# Patient Record
Sex: Female | Born: 1970 | Hispanic: No | Marital: Single | Smoking: Never smoker
Health system: Southern US, Community
[De-identification: ages and names within clinical notes are randomized; demographics above are authoritative.]

## PROBLEM LIST (undated history)

## (undated) DIAGNOSIS — E119 Type 2 diabetes mellitus without complications: Secondary | ICD-10-CM

## (undated) HISTORY — PX: SHOULDER SURGERY: SHX246

---

## 2002-01-02 ENCOUNTER — Other Ambulatory Visit: Admission: RE | Admit: 2002-01-02 | Discharge: 2002-01-02 | Payer: Self-pay | Admitting: Obstetrics and Gynecology

## 2003-01-21 ENCOUNTER — Other Ambulatory Visit: Admission: RE | Admit: 2003-01-21 | Discharge: 2003-01-21 | Payer: Self-pay | Admitting: Obstetrics and Gynecology

## 2004-02-04 ENCOUNTER — Other Ambulatory Visit: Admission: RE | Admit: 2004-02-04 | Discharge: 2004-02-04 | Payer: Self-pay | Admitting: Obstetrics and Gynecology

## 2005-03-23 ENCOUNTER — Other Ambulatory Visit: Admission: RE | Admit: 2005-03-23 | Discharge: 2005-03-23 | Payer: Self-pay | Admitting: Obstetrics and Gynecology

## 2006-06-08 ENCOUNTER — Other Ambulatory Visit: Admission: RE | Admit: 2006-06-08 | Discharge: 2006-06-08 | Payer: Self-pay | Admitting: Obstetrics and Gynecology

## 2010-12-07 ENCOUNTER — Ambulatory Visit: Admission: RE | Admit: 2010-12-07 | Payer: Self-pay | Source: Home / Self Care | Admitting: Emergency Medicine

## 2010-12-07 ENCOUNTER — Ambulatory Visit
Admission: RE | Admit: 2010-12-07 | Discharge: 2010-12-07 | Payer: Self-pay | Source: Home / Self Care | Admitting: Emergency Medicine

## 2010-12-08 DIAGNOSIS — M79609 Pain in unspecified limb: Secondary | ICD-10-CM | POA: Insufficient documentation

## 2010-12-08 DIAGNOSIS — E109 Type 1 diabetes mellitus without complications: Secondary | ICD-10-CM | POA: Insufficient documentation

## 2010-12-14 ENCOUNTER — Telehealth (INDEPENDENT_AMBULATORY_CARE_PROVIDER_SITE_OTHER): Payer: Self-pay | Admitting: *Deleted

## 2010-12-16 NOTE — Assessment & Plan Note (Signed)
Summary: INJURY TO RIGHT GREAT TOE/TJ   Vital Signs:  Patient Profile:   40 Years Old Female CC:      RT great toe pain Height:     68 inches Weight:      165 pounds O2 Sat:      99 % O2 treatment:    Room Air Temp:     98.3 degrees F oral Pulse rate:   107 / minute Pulse rhythm:   regular Resp:     16 per minute BP sitting:   124 / 86  (right arm) Cuff size:   regular  Vitals Entered By: Clemens Catholic LPN (December 08, 2010 8:34 AM)                  Updated Prior Medication List: RELPAX 20 MG TABS (ELETRIPTAN HYDROBROMIDE)  YAZ 3-0.02 MG TABS (DROSPIRENONE-ETHINYL ESTRADIOL)  TOPAMAX 25 MG TABS (TOPIRAMATE)  REQUIP 0.25 MG TABS (ROPINIROLE HCL)  Insulin- pt is unsure of name Current Allergies: ! CODEINEHistory of Present Illness History from: patient Chief Complaint: RT great toe pain History of Present Illness: R great toe pain.  Kicked a faux brick wall today at noon today.  She has developed throbbing pain and bruising and swelling of the toe.  Pain is worse with walking and she is now limping. Motrin helps.  She is concerned because she has to attend a military ball this weekend.  REVIEW OF SYSTEMS Constitutional Symptoms      Denies fever, chills, night sweats, weight loss, weight gain, and fatigue.  Eyes       Denies change in vision, eye pain, eye discharge, glasses, contact lenses, and eye surgery. Ear/Nose/Throat/Mouth       Denies hearing loss/aids, change in hearing, ear pain, ear discharge, dizziness, frequent runny nose, frequent nose bleeds, sinus problems, sore throat, hoarseness, and tooth pain or bleeding.  Respiratory       Denies dry cough, productive cough, wheezing, shortness of breath, asthma, bronchitis, and emphysema/COPD.  Cardiovascular       Denies murmurs, chest pain, and tires easily with exhertion.    Gastrointestinal       Denies stomach pain, nausea/vomiting, diarrhea, constipation, blood in bowel movements, and  indigestion. Genitourniary       Denies painful urination, kidney stones, and loss of urinary control. Neurological       Denies paralysis, seizures, and fainting/blackouts. Musculoskeletal       Denies muscle pain, joint pain, joint stiffness, decreased range of motion, redness, swelling, muscle weakness, and gout.  Skin       Denies bruising, unusual mles/lumps or sores, and hair/skin or nail changes.  Psych       Denies mood changes, temper/anger issues, anxiety/stress, speech problems, depression, and sleep problems. Other Comments: pt c/o RT great toe pain. pt states she kicked a brick wall today. she has taken OTC Motrin.   Past History:  Past Medical History: Diabetes mellitus, type I migraines  Past Surgical History: bilateral shoulder surgery 2007  Family History: mother- diabetes father- MI  Social History: Never Smoked Alcohol use-no Drug use-no Smoking Status:  never Drug Use:  no Physical Exam General appearance: well developed, well nourished, no acute distress but does have limping gait MSE: oriented to time, place, and person R foot: FROM MTP.  ROM of IP is reduced due to pain.  Ecchymoses at IP and prox phalynx of great toe.  Pain with axial loading and TTP at dorsum of IP.  No  pain plantar or sides.  No laxity of collateral ligaments.  Distal NV status intact. Assessment New Problems: DIABETES MELLITUS, TYPE I (ICD-250.01) TOE PAIN (ICD-729.5)   Plan New Orders: New Patient Level III [99203] T-DG Toe Great*R* [73660] Post-op Shoe [L3260] Planning Comments:   Xray is read by radiology as normal.  However on the lateral view I may see a bony defect on the superior-proximal aspect of the distal phalynx.  So may just be impaction contusion vs ligament sprain.  Buddy tape, ice, rest, elevation, Motrin if needed.  Also gave post-op shoe which she feels is more comfortable when walking.  Expect throbbing pain for a few weeks.  I would like her to follow up  with Dr. Pearletha Forge in 1-2 weeks.   The patient and/or caregiver has been counseled thoroughly with regard to medications prescribed including dosage, schedule, interactions, rationale for use, and possible side effects and they verbalize understanding.  Diagnoses and expected course of recovery discussed and will return if not improved as expected or if the condition worsens. Patient and/or caregiver verbalized understanding.   Orders Added: 1)  New Patient Level III [99203] 2)  T-DG Toe Great*R* [73660] 3)  Post-op Shoe [L3260]

## 2010-12-22 NOTE — Progress Notes (Signed)
  Phone Note Outgoing Call Call back at Work Phone [phone]   Call placed by: Emilio Math,  December 14, 2010 12:49 PM Call placed to: Patient Summary of Call: Left msg to ck on patient's toe

## 2014-12-31 ENCOUNTER — Encounter (HOSPITAL_BASED_OUTPATIENT_CLINIC_OR_DEPARTMENT_OTHER): Payer: Self-pay | Admitting: Emergency Medicine

## 2014-12-31 ENCOUNTER — Emergency Department (HOSPITAL_BASED_OUTPATIENT_CLINIC_OR_DEPARTMENT_OTHER)
Admission: EM | Admit: 2014-12-31 | Discharge: 2014-12-31 | Disposition: A | Discharge status: Home / Self Care | Payer: BC Managed Care – PPO | Attending: Emergency Medicine | Admitting: Emergency Medicine

## 2014-12-31 ENCOUNTER — Emergency Department (HOSPITAL_BASED_OUTPATIENT_CLINIC_OR_DEPARTMENT_OTHER): Payer: BC Managed Care – PPO

## 2014-12-31 DIAGNOSIS — Y9289 Other specified places as the place of occurrence of the external cause: Secondary | ICD-10-CM | POA: Diagnosis not present

## 2014-12-31 DIAGNOSIS — Z793 Long term (current) use of hormonal contraceptives: Secondary | ICD-10-CM | POA: Insufficient documentation

## 2014-12-31 DIAGNOSIS — Y9389 Activity, other specified: Secondary | ICD-10-CM | POA: Diagnosis not present

## 2014-12-31 DIAGNOSIS — Z79899 Other long term (current) drug therapy: Secondary | ICD-10-CM | POA: Diagnosis not present

## 2014-12-31 DIAGNOSIS — E119 Type 2 diabetes mellitus without complications: Secondary | ICD-10-CM | POA: Diagnosis not present

## 2014-12-31 DIAGNOSIS — S52122A Displaced fracture of head of left radius, initial encounter for closed fracture: Secondary | ICD-10-CM

## 2014-12-31 DIAGNOSIS — W1839XA Other fall on same level, initial encounter: Secondary | ICD-10-CM | POA: Insufficient documentation

## 2014-12-31 DIAGNOSIS — R52 Pain, unspecified: Secondary | ICD-10-CM

## 2014-12-31 DIAGNOSIS — Y998 Other external cause status: Secondary | ICD-10-CM | POA: Diagnosis not present

## 2014-12-31 DIAGNOSIS — S59902A Unspecified injury of left elbow, initial encounter: Secondary | ICD-10-CM | POA: Diagnosis present

## 2014-12-31 HISTORY — DX: Type 2 diabetes mellitus without complications: E11.9

## 2014-12-31 MED ORDER — IBUPROFEN 800 MG PO TABS
800.0000 mg | ORAL_TABLET | Freq: Once | ORAL | Status: AC
  Administered 2014-12-31: 800 mg via ORAL
  Filled 2014-12-31: qty 1

## 2014-12-31 MED ORDER — IBUPROFEN 800 MG PO TABS: 800.0000 mg | ORAL_TABLET | Freq: Three times a day (TID) | ORAL | Status: AC

## 2014-12-31 MED ORDER — OXYCODONE-ACETAMINOPHEN 5-325 MG PO TABS
2.0000 | ORAL_TABLET | Freq: Once | ORAL | Status: AC
  Administered 2014-12-31: 2 via ORAL
  Filled 2014-12-31: qty 2

## 2014-12-31 MED ORDER — OXYCODONE-ACETAMINOPHEN 5-325 MG PO TABS: 1.0000 | ORAL_TABLET | Freq: Four times a day (QID) | ORAL | Status: AC | PRN

## 2014-12-31 NOTE — ED Provider Notes (Signed)
CSN: [id number]     Arrival date & time 12/31/14  0117 History   First MD Initiated Contact with Patient 12/31/14 0245     Chief Complaint  Patient presents with  . Elbow Pain     (Consider location/radiation/quality/duration/timing/severity/associated sxs/prior Treatment) Patient is a 44 y.o. female presenting with arm injury. The history is provided by the patient.  Arm Injury Location:  Elbow Injury: yes   Mechanism of injury: fall   Fall:    Fall occurred:  Standing and running   Impact surface:  Hard floor   Point of impact: elbow.   Entrapped after fall: no   Elbow location:  L elbow Pain details:    Quality:  Aching   Severity:  Severe   Onset quality:  Sudden   Timing:  Constant Chronicity:  New Handedness:  Right-handed Dislocation: no   Foreign body present:  No foreign bodies Tetanus status:  Up to date Relieved by:  Nothing Worsened by:  Nothing tried Ineffective treatments:  None tried Associated symptoms: no back pain and no neck pain   Risk factors: no frequent fractures     Past Medical History  Diagnosis Date  . Diabetes mellitus without complication    Past Surgical History  Procedure Laterality Date  . Shoulder surgery Bilateral    History reviewed. No pertinent family history. History  Substance Use Topics  . Smoking status: Never Smoker   . Smokeless tobacco: Not on file  . Alcohol Use: No   OB History    No data available     Review of Systems  Musculoskeletal: Negative for back pain and neck pain.  All other systems reviewed and are negative.     Allergies  Codeine  Home Medications   Prior to Admission medications   Medication Sig Start Date End Date Taking? Authorizing Provider  drospirenone-ethinyl estradiol (YAZ,GIANVI,LORYNA) 3-0.02 MG tablet Take 1 tablet by mouth daily.   Yes Historical Provider, MD  Insulin Infusion Pump KIT by Does not apply route.   Yes Historical Provider, MD  topiramate (TOPAMAX) 15 MG capsule  Take 10 mg by mouth 3 (three) times daily.   Yes Historical Provider, MD   BP 124/60 mmHg  Pulse 94  Temp(Src) 98.5 F (36.9 C) (Oral)  Resp 18  Ht _0  (1.702 m)  Wt 170 lb (77.111 kg)  BMI 26.62 kg/m2  SpO2 99%  LMP 12/24/2014 Physical Exam  Constitutional: She is oriented to person, place, and time. She appears well-developed and well-nourished. No distress.  HENT:  Head: Normocephalic and atraumatic.  Mouth/Throat: Oropharynx is clear and moist.  Eyes: Conjunctivae are normal. Pupils are equal, round, and reactive to light.  Neck: Normal range of motion. Neck supple.  Cardiovascular: Normal rate, regular rhythm and intact distal pulses.   Pulmonary/Chest: Effort normal and breath sounds normal. She has no wheezes. She has no rales.  Abdominal: Soft. Bowel sounds are normal. There is no tenderness.  Musculoskeletal: She exhibits tenderness.       Left elbow: She exhibits swelling. She exhibits normal range of motion, no effusion, no deformity and no laceration. Tenderness found. Radial head tenderness noted. No medial epicondyle, no lateral epicondyle and no olecranon process tenderness noted.  Neurological: She is alert and oriented to person, place, and time. She has normal reflexes.  Skin: Skin is warm and dry.  Psychiatric: She has a normal mood and affect.    ED Course  Procedures (including critical care time) Labs Review Labs Reviewed -  No data to display  Imaging Review Dg Elbow Complete Left  12/31/2014   CLINICAL DATA:  Left elbow pain after fall last night  EXAM: LEFT ELBOW - COMPLETE 3+ VIEW  COMPARISON:  None.  FINDINGS: There is a radial head fracture. There is an elbow joint hemarthrosis. The radial head fracture is minimally displaced.  IMPRESSION: Radial head fracture with hemarthrosis   Electronically Signed   By: Andreas Newport M.D.   On: 12/31/2014 02:35     EKG Interpretation None      MDM   Final diagnoses:  Pain    Ice elevation,  percocet, ibuprofen, and close follow up with Dr. Amedeo Plenty.  Strict return precautions given   Marinus Eicher K Amey Hossain-Rasch, MD 12/31/14 [phone]

## 2014-12-31 NOTE — ED Notes (Signed)
Pt states she was running through the house and fell on left elbow injuring it.

## 2019-12-29 ENCOUNTER — Ambulatory Visit: Payer: BC Managed Care – PPO

## 2022-03-31 ENCOUNTER — Other Ambulatory Visit: Payer: Self-pay | Admitting: Obstetrics and Gynecology

## 2022-03-31 DIAGNOSIS — R928 Other abnormal and inconclusive findings on diagnostic imaging of breast: Secondary | ICD-10-CM

## 2022-04-28 ENCOUNTER — Ambulatory Visit
Admission: RE | Admit: 2022-04-28 | Discharge: 2022-04-28 | Disposition: A | Discharge status: Home / Self Care | Payer: BC Managed Care – PPO | Source: Ambulatory Visit | Attending: Obstetrics and Gynecology | Admitting: Obstetrics and Gynecology

## 2022-04-28 DIAGNOSIS — R928 Other abnormal and inconclusive findings on diagnostic imaging of breast: Secondary | ICD-10-CM

## 2023-07-13 ENCOUNTER — Encounter (INDEPENDENT_AMBULATORY_CARE_PROVIDER_SITE_OTHER): Payer: Self-pay

## 2023-09-03 IMAGING — US US BREAST*R* LIMITED INC AXILLA
1 series · 7 of 7 positions shown · non-contrast
Comparison: Previous exam(s).

CLINICAL DATA: Mass in the right breast

EXAM:
DIGITAL DIAGNOSTIC UNILATERAL RIGHT MAMMOGRAM WITH TOMOSYNTHESIS AND
CAD; ULTRASOUND RIGHT BREAST LIMITED
TECHNIQUE: Right digital diagnostic mammography and breast tomosynthesis was
performed. The images were evaluated with computer-aided detection.;
Targeted ultrasound examination of the right breast was performed

[Series 1: us breast*right* limited inc axilla · 0.06mm/px · 7 of 7 slices shown]
[im 1/7]
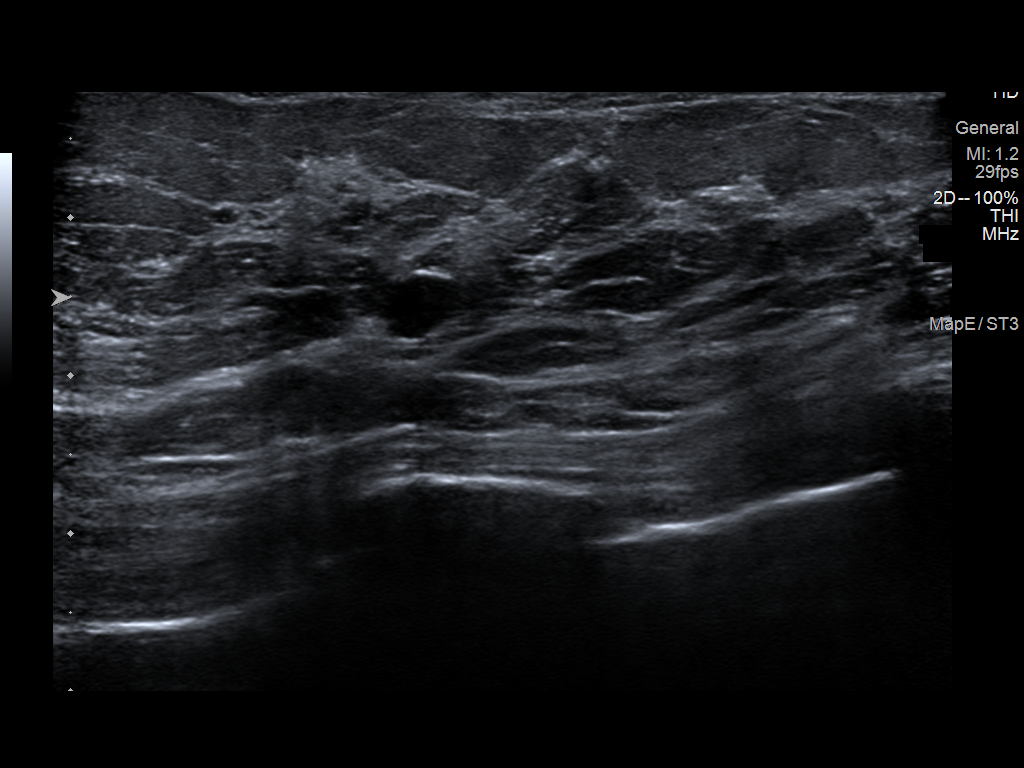
[im 2/7]
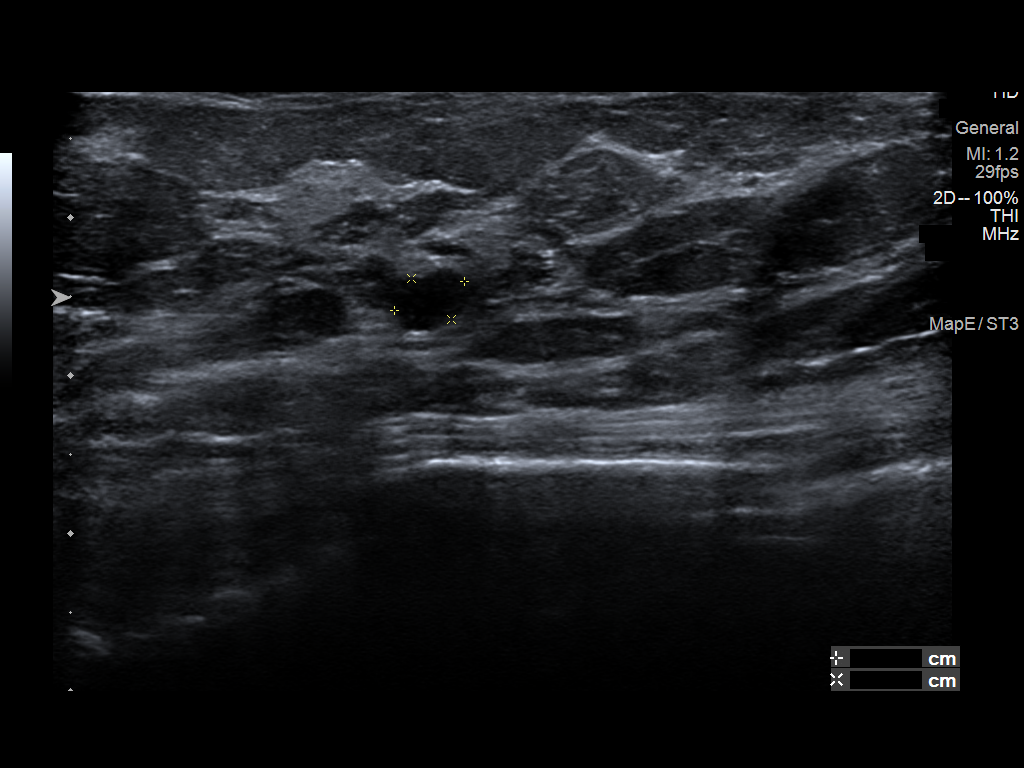
[im 3/7]
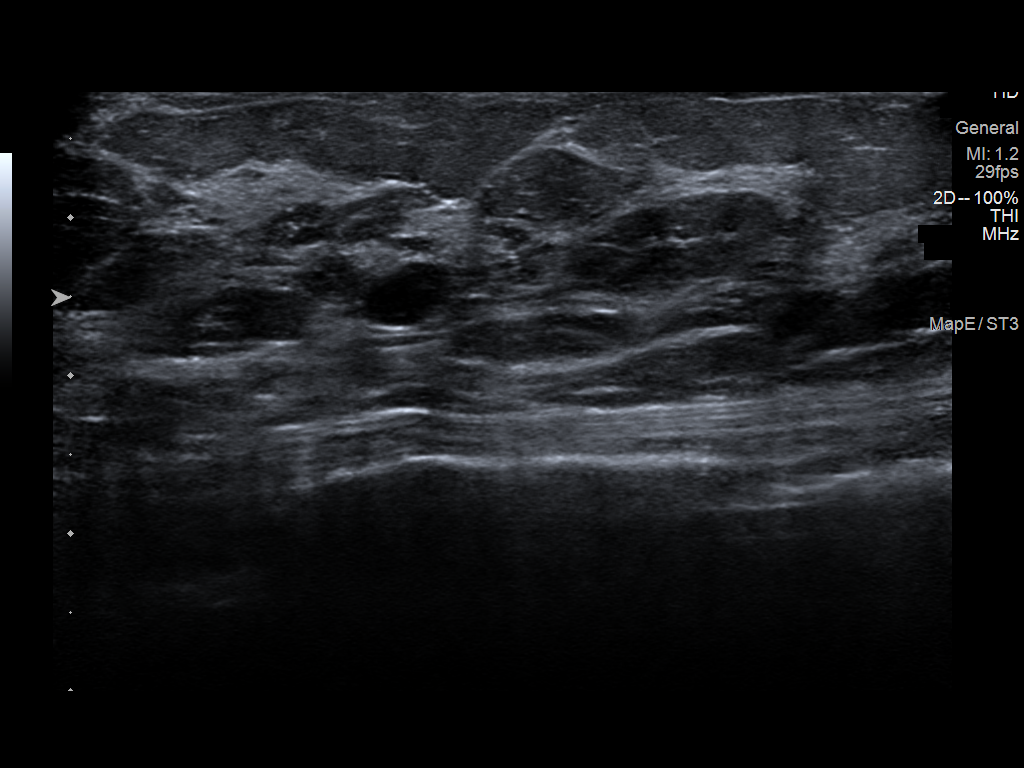
[im 4/7]
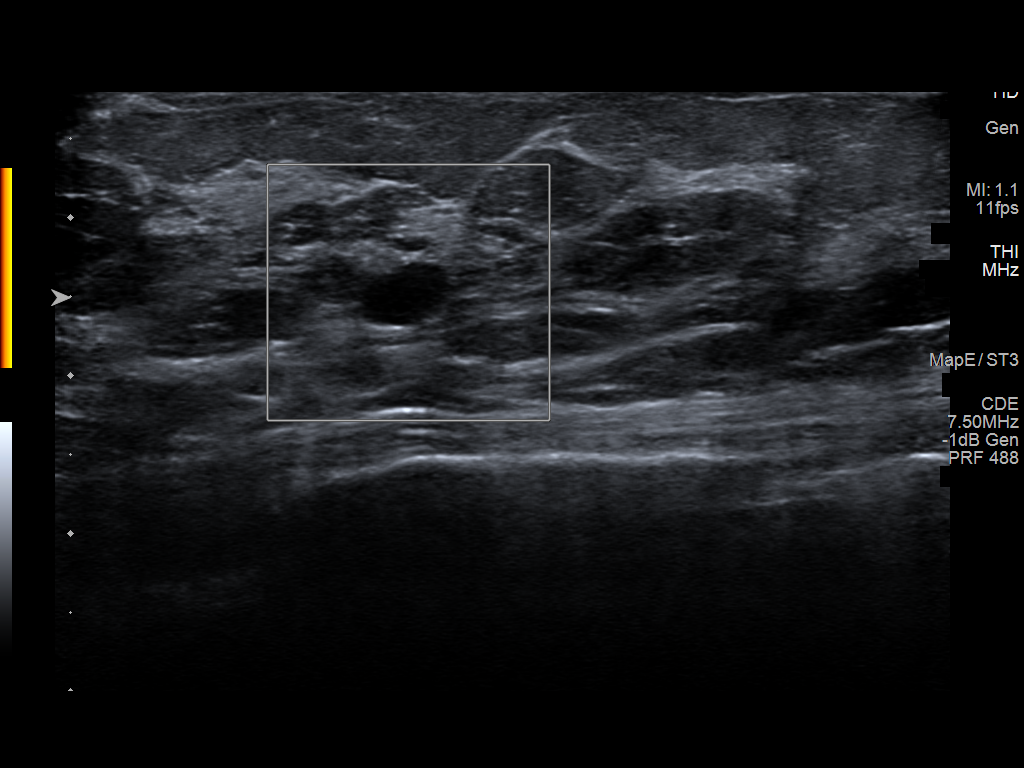
[im 5/7]
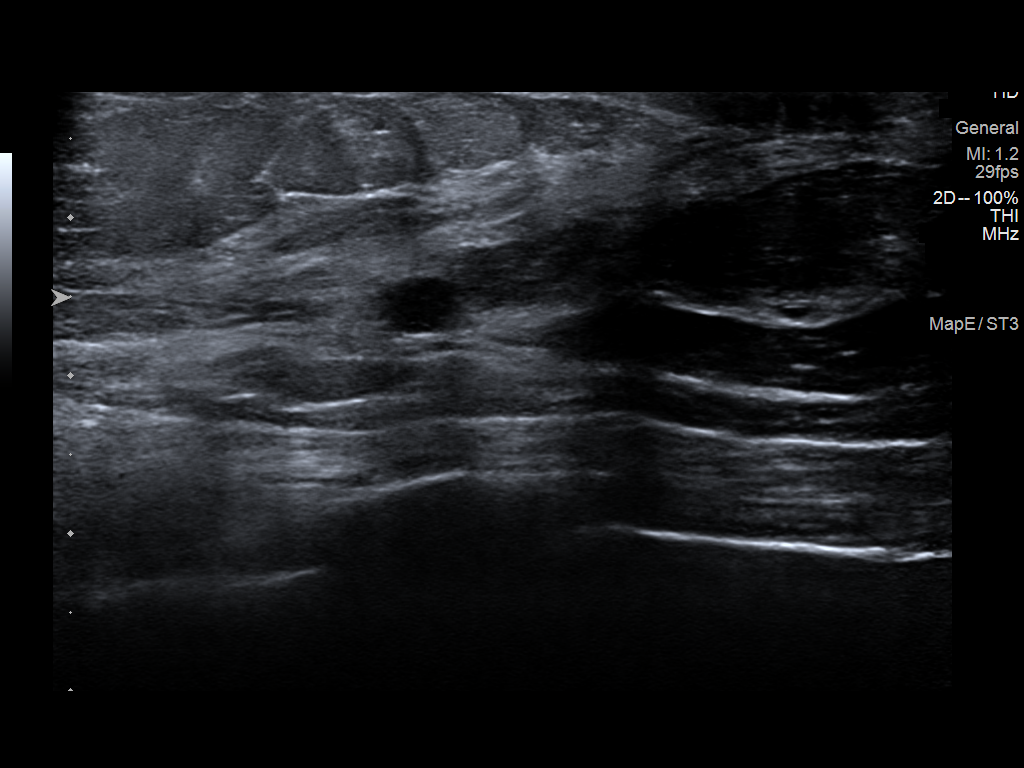
[im 6/7]
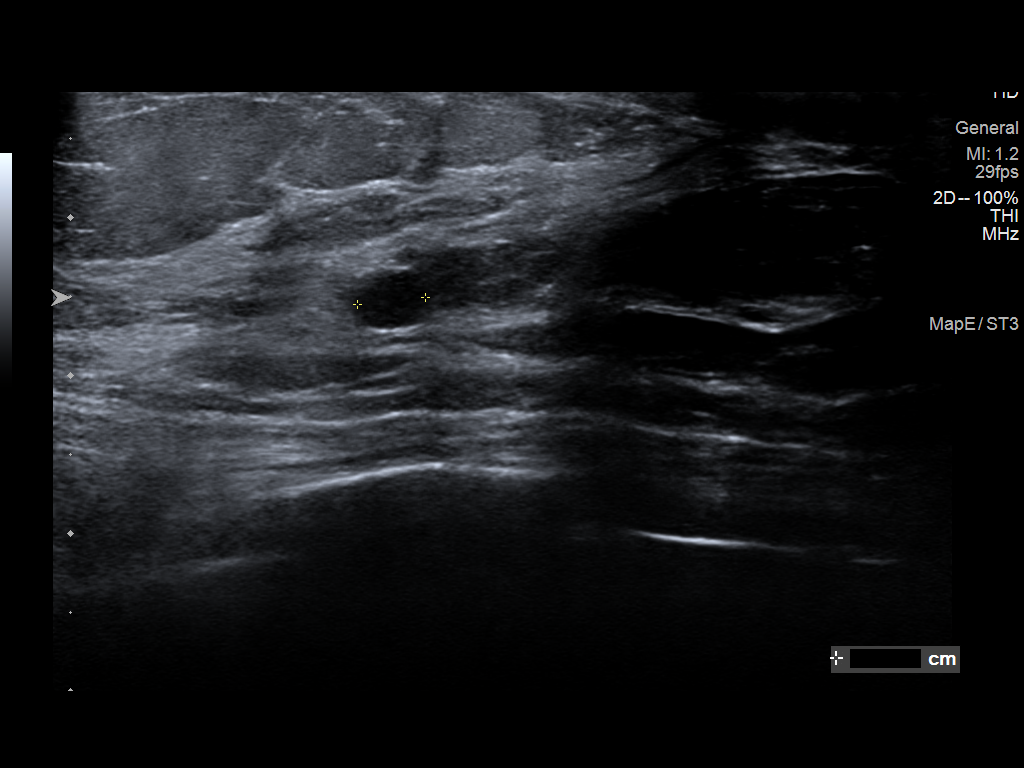
[im 7/7]
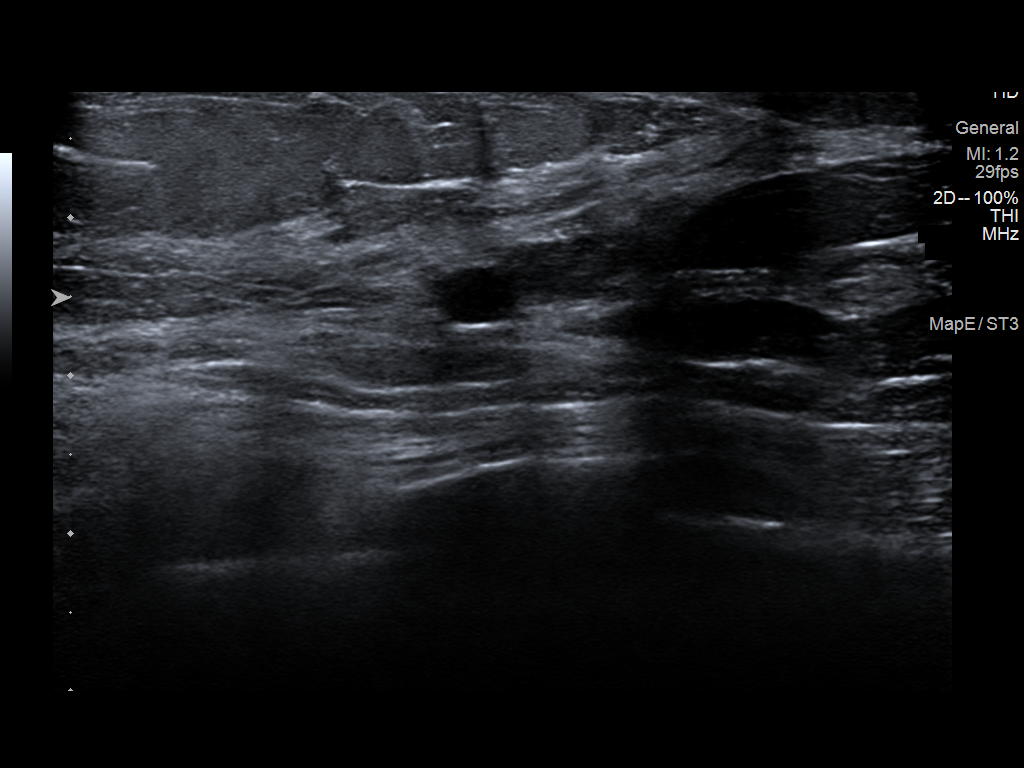

[7 of 7 positions shown; findings below may reference images not displayed]

ACR Breast Density Category b: There are scattered areas of
fibroglandular density.
FINDINGS: The mass in the right breast persists on additional imaging.

On physical exam, no suspicious lumps are identified.

Targeted ultrasound is performed, showing a simple cyst in the right
breast at 8 o'clock accounting for the mammographically identified
mass.
IMPRESSION: The right breast mass is a simple cyst.  No evidence of malignancy.

RECOMMENDATION:
Annual screening mammography.

I have discussed the findings and recommendations with the patient.
If applicable, a reminder letter will be sent to the patient
regarding the next appointment.

BI-RADS CATEGORY  2: Benign.
